# Patient Record
Sex: Female | Born: 1970 | Race: White | Hispanic: No | Marital: Married | Smoking: Former smoker
Health system: Southern US, Community
[De-identification: ages and names within clinical notes are randomized; demographics above are authoritative.]

## PROBLEM LIST (undated history)

## (undated) DIAGNOSIS — K602 Anal fissure, unspecified: Secondary | ICD-10-CM

## (undated) DIAGNOSIS — E669 Obesity, unspecified: Secondary | ICD-10-CM

## (undated) DIAGNOSIS — K219 Gastro-esophageal reflux disease without esophagitis: Secondary | ICD-10-CM

## (undated) DIAGNOSIS — E079 Disorder of thyroid, unspecified: Secondary | ICD-10-CM

## (undated) DIAGNOSIS — J449 Chronic obstructive pulmonary disease, unspecified: Secondary | ICD-10-CM

## (undated) DIAGNOSIS — T7840XA Allergy, unspecified, initial encounter: Secondary | ICD-10-CM

## (undated) DIAGNOSIS — M1990 Unspecified osteoarthritis, unspecified site: Secondary | ICD-10-CM

## (undated) HISTORY — DX: Obesity, unspecified: E66.9

## (undated) HISTORY — DX: Unspecified osteoarthritis, unspecified site: M19.90

## (undated) HISTORY — DX: Chronic obstructive pulmonary disease, unspecified: J44.9

## (undated) HISTORY — PX: HAND TENDON SURGERY: SHX663

## (undated) HISTORY — DX: Gastro-esophageal reflux disease without esophagitis: K21.9

## (undated) HISTORY — DX: Allergy, unspecified, initial encounter: T78.40XA

## (undated) HISTORY — PX: HAND SURGERY: SHX662

## (undated) HISTORY — DX: Disorder of thyroid, unspecified: E07.9

## (undated) HISTORY — DX: Anal fissure, unspecified: K60.2

---

## 2000-04-20 ENCOUNTER — Other Ambulatory Visit: Admission: RE | Admit: 2000-04-20 | Discharge: 2000-04-20 | Payer: Self-pay | Admitting: Obstetrics and Gynecology

## 2000-11-29 ENCOUNTER — Encounter (INDEPENDENT_AMBULATORY_CARE_PROVIDER_SITE_OTHER): Payer: Self-pay | Admitting: Specialist

## 2000-11-29 ENCOUNTER — Inpatient Hospital Stay (HOSPITAL_COMMUNITY): Admission: AD | Admit: 2000-11-29 | Discharge: 2000-12-01 | Payer: Self-pay | Admitting: Obstetrics and Gynecology

## 2001-04-18 ENCOUNTER — Other Ambulatory Visit: Admission: RE | Admit: 2001-04-18 | Discharge: 2001-04-18 | Payer: Self-pay | Admitting: Obstetrics & Gynecology

## 2001-10-25 ENCOUNTER — Encounter: Admission: RE | Admit: 2001-10-25 | Discharge: 2001-10-25 | Payer: Self-pay | Admitting: Family Medicine

## 2001-10-25 ENCOUNTER — Encounter: Payer: Self-pay | Admitting: Family Medicine

## 2004-03-24 ENCOUNTER — Encounter: Admission: RE | Admit: 2004-03-24 | Discharge: 2004-03-24 | Payer: Self-pay | Admitting: Family Medicine

## 2008-08-31 ENCOUNTER — Ambulatory Visit: Payer: Self-pay | Admitting: Cardiovascular Disease

## 2008-08-31 ENCOUNTER — Encounter: Payer: Self-pay | Admitting: Cardiovascular Disease

## 2008-08-31 LAB — CONVERTED CEMR LAB
BUN: 12 mg/dL (ref 6–23)
Basophils Relative: 4.6 % — ABNORMAL HIGH (ref 0.0–3.0)
CO2: 28 meq/L (ref 19–32)
Calcium: 9.2 mg/dL (ref 8.4–10.5)
Chloride: 106 meq/L (ref 96–112)
Creatinine, Ser: 0.9 mg/dL (ref 0.4–1.2)
Eosinophils Relative: 2.6 % (ref 0.0–5.0)
Free T4: 0.7 ng/dL (ref 0.6–1.6)
GFR calc non Af Amer: 73.78 mL/min (ref >60)
Glucose, Bld: 145 mg/dL — ABNORMAL HIGH (ref 70–99)
HCT: 43.2 % (ref 36.0–46.0)
Hemoglobin: 15 g/dL (ref 12.0–15.0)
Lymphocytes Relative: 29.6 % (ref 12.0–46.0)
MCHC: 34.6 g/dL (ref 30.0–36.0)
MCV: 89.6 fL (ref 78.0–100.0)
Monocytes Relative: 2.8 % — ABNORMAL LOW (ref 3.0–12.0)
Neutrophils Relative %: 60.4 % (ref 43.0–77.0)
Platelets: 250 10*3/uL (ref 150.0–400.0)
Potassium: 3.6 meq/L (ref 3.5–5.1)
Pro B Natriuretic peptide (BNP): 24 pg/mL (ref 0.0–100.0)
RBC: 4.82 M/uL (ref 3.87–5.11)
RDW: 12.8 % (ref 11.5–14.6)
Sed Rate: 16 mm/hr (ref 0–22)
Sodium: 140 meq/L (ref 135–145)
TSH: 1.37 microintl units/mL (ref 0.35–5.50)
WBC: 7.3 10*3/uL (ref 4.5–10.5)

## 2008-09-07 ENCOUNTER — Telehealth: Payer: Self-pay | Admitting: Cardiovascular Disease

## 2008-09-21 ENCOUNTER — Encounter: Payer: Self-pay | Admitting: Cardiovascular Disease

## 2008-09-21 ENCOUNTER — Ambulatory Visit: Payer: Self-pay

## 2008-09-21 ENCOUNTER — Ambulatory Visit: Payer: Self-pay | Admitting: Internal Medicine

## 2008-10-02 ENCOUNTER — Telehealth: Payer: Self-pay | Admitting: Cardiovascular Disease

## 2008-10-18 ENCOUNTER — Telehealth: Payer: Self-pay | Admitting: Cardiovascular Disease

## 2010-10-07 NOTE — Assessment & Plan Note (Signed)
[facility]                            CARDIOLOGY OFFICE NOTE   SHERELLE, CASTELLI                 MRN:          [id number]  DATE:08/31/2008                            DOB:          [birth year]    Ms. Alison Odonnell is a 40 year old patient followed by Dr. Purnell Shoemaker for an abnormal  EKG.  Unfortunately, I do not have any of these EKGs that there were  done at his office.  The patient indicates she was here for some  shortness of breath.  He did an initial EKG which was normal.  She  subsequently did 3 or 4 EKGs and showed some abnormality.  In talking to  the patient, she has not had palpitations or syncope.  She has had some  pressure in her chest with exertion.  However, primary complaint is  shortness of breath.  This has been increasing over the past year.  She  smokes as much as 2 packs per day.  She has quit in the past, but has  over 25-pack-year history of smoking.  She has not had a recent chest x-  ray or PFTs.  She does have a smoker's cough and a bit of a bronchitic  voice.  She has gained probably about 30 pounds over the last year.   The patient's review of system is otherwise negative.   Her past medical history is remarkable for smoking.  She has had tendon  surgery in the left hand, some tendinitis in the right wrist.   Her review of system is remarkable for history of reflux, chronic  fatigue, history of shortness of breath.   Family history is remarkable for father having a heart attack at age 65.  Mother is still alive.   She is only on Nexium for reflux.  She is happily married.  Her husband  is not with her.  He is older in his 41s, so her father was with her  today.  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  She does not exercise.  She has gained a quite a bit of weight.  She  smokes as much as 2 packs per day and does not drink.  She enjoys taking  care of dogs.  She has 3 Engineer, petroleum at home.   ALLERGIES:  She is allergic to PENICILLIN and SULFA.   PHYSICAL EXAMINATION:  VITAL SIGNS:  Remarkable for weight of 232, blood  pressure 130/80, pulse 76 and regular.  I monitored pulse throughout our  examined and there were no irregularities.  HEENT:  Unremarkable.  NECK:  Carotids have a right bruit.  No lymphadenopathy, thyromegaly, or  JVP elevation.  HEENT:  Unremarkable.  NECK:  Carotids are normal without bruit.  No lymphadenopathy,  thyromegaly, or JVP elevation.  LUNGS:  Clear.  Good diaphragmatic motion.  No wheezing.  CARDIAC:  S1, S2.  Normal heart sounds.  PMI normal.  ABDOMEN:  Benign.  Bowel sounds positive.  No AAA, no tenderness, no  bruit, no hepatosplenomegaly, no hepatojugular reflux, or tenderness.  EXTREMITIES:  Distal pulses are intact.  No edema.  She has  2 areas on  the right chin and 1 area on the left shin of erythema with plaquing.  She has punch biospy scars in the center of the 2 right ones.  NEURO:  Nonfocal.  SKIN:  Warm and dry.  MUSCULOSKELETAL:  No muscular weakness.   EKG is normal.  QT interval is 386.   IMPRESSION:  1. Abnormal EKG.  I will try to get the results from Dr. Harlene Salts      office, but apparently he has older machines and I suspect there      were artifact.  Her EKG is totally normal here and palpitations is      not really an issue for her.  2. Dyspnea, somewhat more worrisome.  She is a long-time smoker.  She      has a bronchitic voice.  We will get a chest x-ray today.  She will      have PFTs pre and post bronchodilator.  I suspect she has some      emphysema just coupled with her significant weight gain probably      explains it.  3. Right carotid bruit.  Check carotid duplex, probably will require      baby aspirin therapy.  4. In regards to her dyspnea since she does have a carotid bruit, we      will at least check a 2-D echocardiogram, assess RV and LV      function, rule out pulmonary hypertension.  Depending on her       initial workup, she may end up needing a treadmill test or      cardiopulmonary stress test.  We will check basic lab work today in      regards to her dyspnea.  We will check a CBC, BMP, TSH, T4, and a      sed rate.  5. Dermatological lesions in the lower extremities.  These are      somewhat concerning to me.  They initially started with a bruise.      She apparently said the biopsies only showed scar tissue.  However,      they seem more active and she says the lower one on the right shin      is spread.  I would like her to be seen by a dermatologist, as I      would like to make sure that there is no evidence of lichen planus,      discoid lupus, or T-cell lymphoma.   We will try to get her in to see [facility].  The patient  would like get these looked at again, as they keep her from wearing  capris and other shorts.   I will see her back after her testing.     [name]. Eden Emms, MD, [facility]  Electronically Signed    PCN/MedQ  DD: 08/31/2008  DT: 09/01/2008  Job #: [id number]

## 2012-01-27 ENCOUNTER — Other Ambulatory Visit: Payer: Self-pay | Admitting: Nurse Practitioner

## 2012-01-27 DIAGNOSIS — M549 Dorsalgia, unspecified: Secondary | ICD-10-CM

## 2012-01-27 DIAGNOSIS — M25559 Pain in unspecified hip: Secondary | ICD-10-CM

## 2012-01-29 ENCOUNTER — Other Ambulatory Visit: Payer: Self-pay

## 2012-02-05 ENCOUNTER — Other Ambulatory Visit: Payer: Self-pay

## 2012-08-13 ENCOUNTER — Other Ambulatory Visit: Payer: Self-pay | Admitting: Neurology

## 2013-03-24 ENCOUNTER — Other Ambulatory Visit: Payer: Self-pay | Admitting: Neurology

## 2013-03-28 ENCOUNTER — Other Ambulatory Visit: Payer: Self-pay

## 2013-03-28 NOTE — Telephone Encounter (Signed)
I have attempted to contact patient by phone at home and work. Both numbers are disconnected. There is a request for a medication refill. I will submit x 1 month, per previous order, and add note to call to schedule appointment.

## 2013-03-30 ENCOUNTER — Other Ambulatory Visit: Payer: Self-pay | Admitting: Neurology

## 2013-03-30 MED ORDER — BACLOFEN 10 MG PO TABS: 10.0000 mg | ORAL_TABLET | Freq: Every day | ORAL | Status: DC

## 2014-03-21 ENCOUNTER — Other Ambulatory Visit: Payer: Self-pay | Admitting: Obstetrics & Gynecology

## 2014-03-21 DIAGNOSIS — R928 Other abnormal and inconclusive findings on diagnostic imaging of breast: Secondary | ICD-10-CM

## 2014-04-03 ENCOUNTER — Ambulatory Visit
Admission: RE | Admit: 2014-04-03 | Discharge: 2014-04-03 | Disposition: A | Discharge status: Home / Self Care | Payer: BC Managed Care – PPO | Source: Ambulatory Visit | Attending: Obstetrics & Gynecology | Admitting: Obstetrics & Gynecology

## 2014-04-03 DIAGNOSIS — R928 Other abnormal and inconclusive findings on diagnostic imaging of breast: Secondary | ICD-10-CM

## 2014-11-13 ENCOUNTER — Other Ambulatory Visit (HOSPITAL_COMMUNITY): Payer: Self-pay | Admitting: Nurse Practitioner

## 2014-11-13 DIAGNOSIS — R1011 Right upper quadrant pain: Secondary | ICD-10-CM

## 2014-12-04 ENCOUNTER — Ambulatory Visit (HOSPITAL_COMMUNITY)
Admission: RE | Admit: 2014-12-04 | Discharge: 2014-12-04 | Disposition: A | Discharge status: Home / Self Care | Payer: BLUE CROSS/BLUE SHIELD | Source: Ambulatory Visit | Attending: Nurse Practitioner | Admitting: Nurse Practitioner

## 2014-12-04 DIAGNOSIS — R1011 Right upper quadrant pain: Secondary | ICD-10-CM | POA: Insufficient documentation

## 2014-12-04 MED ORDER — SINCALIDE 5 MCG IJ SOLR
0.0200 ug/kg | Freq: Once | INTRAMUSCULAR | Status: AC
  Administered 2014-12-04: 2.1 ug via INTRAVENOUS

## 2014-12-04 MED ORDER — TECHNETIUM TC 99M MEBROFENIN IV KIT
5.5000 | PACK | Freq: Once | INTRAVENOUS | Status: AC | PRN
  Administered 2014-12-04: 6 via INTRAVENOUS

## 2015-01-17 ENCOUNTER — Ambulatory Visit (INDEPENDENT_AMBULATORY_CARE_PROVIDER_SITE_OTHER): Payer: BLUE CROSS/BLUE SHIELD | Admitting: Physician Assistant

## 2015-01-17 ENCOUNTER — Encounter: Payer: Self-pay | Admitting: Gastroenterology

## 2015-01-17 ENCOUNTER — Encounter: Payer: Self-pay | Admitting: Physician Assistant

## 2015-01-17 ENCOUNTER — Other Ambulatory Visit (INDEPENDENT_AMBULATORY_CARE_PROVIDER_SITE_OTHER): Payer: BLUE CROSS/BLUE SHIELD

## 2015-01-17 VITALS — BP 110/76 | HR 64 | Ht 66.0 in | Wt 239.0 lb

## 2015-01-17 DIAGNOSIS — R1013 Epigastric pain: Secondary | ICD-10-CM | POA: Diagnosis not present

## 2015-01-17 DIAGNOSIS — K589 Irritable bowel syndrome without diarrhea: Secondary | ICD-10-CM | POA: Insufficient documentation

## 2015-01-17 DIAGNOSIS — K219 Gastro-esophageal reflux disease without esophagitis: Secondary | ICD-10-CM | POA: Diagnosis not present

## 2015-01-17 LAB — CBC WITH DIFFERENTIAL/PLATELET
Basophils Absolute: 0.1 10*3/uL (ref 0.0–0.1)
Basophils Relative: 0.9 % (ref 0.0–3.0)
Eosinophils Absolute: 0.2 10*3/uL (ref 0.0–0.7)
Eosinophils Relative: 2.1 % (ref 0.0–5.0)
HCT: 43 % (ref 36.0–46.0)
Hemoglobin: 14.9 g/dL (ref 12.0–15.0)
Lymphocytes Relative: 24.6 % (ref 12.0–46.0)
Lymphs Abs: 2 10*3/uL (ref 0.7–4.0)
MCHC: 34.7 g/dL (ref 30.0–36.0)
MCV: 93.9 fl (ref 78.0–100.0)
Monocytes Absolute: 0.7 10*3/uL (ref 0.1–1.0)
Monocytes Relative: 8.2 % (ref 3.0–12.0)
Neutro Abs: 5.3 10*3/uL (ref 1.4–7.7)
Neutrophils Relative %: 64.2 % (ref 43.0–77.0)
Platelets: 316 10*3/uL (ref 150.0–400.0)
RBC: 4.59 Mil/uL (ref 3.87–5.11)
RDW: 13.4 % (ref 11.5–15.5)
WBC: 8.3 10*3/uL (ref 4.0–10.5)

## 2015-01-17 LAB — COMPREHENSIVE METABOLIC PANEL
ALT: 28 U/L (ref 0–35)
AST: 20 U/L (ref 0–37)
Albumin: 4.5 g/dL (ref 3.5–5.2)
Alkaline Phosphatase: 64 U/L (ref 39–117)
BUN: 19 mg/dL (ref 6–23)
CO2: 27 mEq/L (ref 19–32)
Calcium: 9.7 mg/dL (ref 8.4–10.5)
Chloride: 103 mEq/L (ref 96–112)
Creatinine, Ser: 1.04 mg/dL (ref 0.40–1.20)
GFR: 60.59 mL/min (ref >60.00)
Glucose, Bld: 102 mg/dL — ABNORMAL HIGH (ref 70–99)
Potassium: 4.2 mEq/L (ref 3.5–5.1)
Sodium: 136 mEq/L (ref 135–145)
Total Bilirubin: 0.3 mg/dL (ref 0.2–1.2)
Total Protein: 7.3 g/dL (ref 6.0–8.3)

## 2015-01-17 LAB — HIGH SENSITIVITY CRP: CRP, High Sensitivity: 6.53 mg/L — ABNORMAL HIGH (ref 0.000–5.000)

## 2015-01-17 LAB — LIPASE: Lipase: 17 U/L (ref 11.0–59.0)

## 2015-01-17 LAB — IGA: IgA: 83 mg/dL (ref 68–378)

## 2015-01-17 MED ORDER — ESOMEPRAZOLE MAGNESIUM 40 MG PO CPDR: DELAYED_RELEASE_CAPSULE | ORAL | Status: DC

## 2015-01-17 MED ORDER — GLYCOPYRROLATE 1 MG PO TABS: ORAL_TABLET | ORAL | Status: DC

## 2015-01-17 NOTE — Patient Instructions (Addendum)
Please go to the basement level to have your labs drawn.   You have been scheduled for an endoscopy. Please follow written instructions given to you at your visit today. If you use inhalers (even only as needed), please bring them with you on the day of your procedure. Your physician has requested that you go to www.startemmi.com and enter the access code given to you at your visit today. This web site gives a general overview about your procedure. However, you should still follow specific instructions given to you by our office regarding your preparation for the procedure. We ordered the Nexium and Robinul forte 2 mg. [employer], [place] , [place].

## 2015-01-17 NOTE — Progress Notes (Signed)
Patient ID: Alison Odonnell, female   DOB: 05-03-1971, 44 y.o.   MRN: [id number]   Subjective:    Patient ID: Alison Odonnell, female    DOB: 02-01-1971, 44 y.o.   MRN: [id number]  HPI Alison Odonnell is a 44 year old white female new to GI today referred by [name] for evaluation of upper abdominal pain. Patient says that she began having symptoms of epigastric pain in March 2016 and her symptoms have gradually worsened since then. She is now having pain and discomfort every day which varies in intensity. She states that eating larger meals seems to worsen her pain and cause radiation through into her back. She has not had any nausea or vomiting appetite has been okay. She has gained about 80 pounds over the past 2 years. Bowel habits seem to alternate back and forth between loose stools and constipation. She had also been having some left lower quadrant discomfort the spring which she says is not been bothering her as much recently. He had upper abdominal ultrasound done at [facility] in [place] Spring 2016 which was read as normal. She was in [place] in March 2016 and having a lot of epigastric pain and had CT of the abdomen and pelvis done through an ER visit there. This is visible in Care Everywhere- she did not have oral contrast but this was done with IV contrast and was negative. More recent [facility] HIDA scan within normal limits. She was also H. pylori positive and treated with a course of PPI clarithromycin and metronidazole for 14 days with no improvement in her symptoms. She has had some heartburn and indigestion despite Prilosec. She also denies any current use of aspirin or NSAIDs. She has been on Prilosec 20 mg by mouth twice daily over the past couple of months though she says she frequently forgets the second dose but again no improvement in her symptoms. During the course of the interview patient tells me that her daughter is being worked up for a tumor on her spine and  that her husband has stage IV/stable lung cancer.  Review of Systems Pertinent positive and negative review of systems were noted in the above HPI section.  All other review of systems was otherwise negative.  Outpatient Encounter Prescriptions as of 01/17/2015  Medication Sig  . levothyroxine (SYNTHROID, LEVOTHROID) 50 MCG tablet Take 50 mcg by mouth daily before breakfast.  . omeprazole (PRILOSEC) 20 MG capsule Take 20 mg by mouth 2 (two) times daily before a meal.  . tizanidine (ZANAFLEX) 2 MG capsule Take 2 mg by mouth 3 (three) times daily as needed for muscle spasms.  Marland Kitchen esomeprazole (NEXIUM) 40 MG capsule Take 1 capsule in the am daily.  Marland Kitchen glycopyrrolate (ROBINUL) 1 MG tablet Take 1 tab in the am.  . [DISCONTINUED] baclofen (LIORESAL) 10 MG tablet Take 1 tablet (10 mg total) by mouth at bedtime. (Patient not taking: Reported on 01/17/2015)   No facility-administered encounter medications on file as of 01/17/2015.   No Known Allergies Patient Active Problem List   Diagnosis Date Noted  . GERD (gastroesophageal reflux disease) 01/17/2015  . IBS (irritable bowel syndrome) 01/17/2015   Social History   Social History  . Marital Status: Married    Spouse Name: N/A  . Number of Children: N/A  . Years of Education: N/A   Occupational History  . Artist    Social History Main Topics  . Smoking status: Current Every Day Smoker  .  Smokeless tobacco: Never Used  . Alcohol Use: No  . Drug Use: No  . Sexual Activity: Not on file   Other Topics Concern  . Not on file   Social History Narrative    [name]'s family history includes Bladder Cancer in her mother; Diabetes in her mother; Heart disease in her father.      Objective:    Filed Vitals:   01/17/15 0833  BP: 110/76  Pulse: 64    Physical Exam  well-developed white female in no acute distress, blood pressure 110/76 pulse 64 height 5 foot 6 weight 239, MI 38.5. HEENT; nontraumatic normocephalic EOMI  PERRLA sclera anicteric, Supple; no JVD, Cardiovascular; regular rate and rhythm with S1-S2 no murmur or gallop, Pulmonary clear bilaterally, Abdomen; large soft she has tenderness in the epigastrium and right upper quadrant there is no guarding or rebound no palpable mass or hepatosplenomegaly bowel sounds are present, Rectal ;exam not done, Extremities; no clubbing cyanosis or edema skin warm and dry, Neuropsych; mood and affect appropriate       Assessment & Plan:   #1 44 yo female with several month hx of epigastric pain- negative workup to date- with [place] /[facility] HIDA/ Ct Abd 3/16, and no change in sxs with PPI or treatment for HPylori. Etiology of sxs is not clear. R/O PUD, other gastropathy, functional dyspepsia/stress induced  Plan; Schedule for EGD with Dr Russella Dar . Procedure discussed in detail with pt and she is agreeable to proceed. Consider repeat CT abd/pelvis if EGD negative  Stop prilosec and start Nexium 40 mg po qam Trial of Robinul forte 2 mg po qam Celiac markers,cbc cmet, crp       Qusai Kem S Adalin Vanderploeg PA-C 01/17/2015   Cc: No ref. provider found

## 2015-01-17 NOTE — Progress Notes (Signed)
Reviewed and agree with management plan.  Ezariah Nace T. Karolyna Bianchini, MD FACG 

## 2015-01-18 LAB — TISSUE TRANSGLUTAMINASE, IGA: Tissue Transglutaminase Ab, IgA: 1 U/mL (ref <4)

## 2015-01-21 ENCOUNTER — Telehealth: Payer: Self-pay | Admitting: Physician Assistant

## 2015-01-21 NOTE — Telephone Encounter (Signed)
Patient is aware of her lab results. Very anxious to get the EGD. She has limitations on her time for the EGD but really wants to do it sooner. Can she change providers?

## 2015-01-22 ENCOUNTER — Other Ambulatory Visit: Payer: Self-pay

## 2015-01-22 DIAGNOSIS — R1013 Epigastric pain: Secondary | ICD-10-CM

## 2015-01-22 NOTE — Telephone Encounter (Signed)
OK. Transferring care to [facility].

## 2015-01-22 NOTE — Telephone Encounter (Signed)
OK to add on in [facility] as a 4pm case for me next week or can change providers if she prefers

## 2015-01-22 NOTE — Telephone Encounter (Signed)
I have left message for the patient to call back 

## 2015-01-22 NOTE — Telephone Encounter (Signed)
That is fine, thanks for letting me know. I will see her on 01/24/15 for the EGD. Thanks

## 2015-01-22 NOTE — Telephone Encounter (Signed)
Alison Odonnell- this pt is anxious to get EGD done sooner- she was new when I saw her - are you ok with me swithching her to Dr Adela Lank if he can do EGD sooner?  Beth- if Dr Russella Dar ok's then go ahead with EGD with Dr. Adela Lank

## 2015-01-22 NOTE — Telephone Encounter (Signed)
Thank you Dr Russella Dar. I did move her to Dr Lanetta Inch schedule. Your schedule was full except for 01/29/15 which was the day she could not come in. I have explained the situation to the patient.

## 2015-01-22 NOTE — Telephone Encounter (Signed)
Dr Adela Lank For your review. She has an EGD with you on 01/24/15.

## 2015-01-24 ENCOUNTER — Encounter: Payer: Self-pay | Admitting: Gastroenterology

## 2015-01-24 ENCOUNTER — Ambulatory Visit (AMBULATORY_SURGERY_CENTER): Payer: BLUE CROSS/BLUE SHIELD | Admitting: Gastroenterology

## 2015-01-24 ENCOUNTER — Encounter: Payer: BLUE CROSS/BLUE SHIELD | Admitting: Gastroenterology

## 2015-01-24 VITALS — BP 121/68 | HR 58 | Temp 97.7°F | Resp 15 | Ht 66.0 in | Wt 239.0 lb

## 2015-01-24 DIAGNOSIS — R1013 Epigastric pain: Secondary | ICD-10-CM | POA: Diagnosis present

## 2015-01-24 MED ORDER — SODIUM CHLORIDE 0.9 % IV SOLN: 500.0000 mL | INTRAVENOUS | Status: DC

## 2015-01-24 NOTE — Patient Instructions (Signed)
Discharge instructions given. Biopsies taken. Resume previous medications. YOU HAD AN ENDOSCOPIC PROCEDURE TODAY AT THE [facility]:   Refer to the procedure report that was given to you for any specific questions about what was found during the examination.  If the procedure report does not answer your questions, please call your gastroenterologist to clarify.  If you requested that your care partner not be given the details of your procedure findings, then the procedure report has been included in a sealed envelope for you to review at your convenience later.  YOU SHOULD EXPECT: Some feelings of bloating in the abdomen. Passage of more gas than usual.  Walking can help get rid of the air that was put into your GI tract during the procedure and reduce the bloating. If you had a lower endoscopy (such as a colonoscopy or flexible sigmoidoscopy) you may notice spotting of blood in your stool or on the toilet paper. If you underwent a bowel prep for your procedure, you may not have a normal bowel movement for a few days.  Please Note:  You might notice some irritation and congestion in your nose or some drainage.  This is from the oxygen used during your procedure.  There is no need for concern and it should clear up in a day or so.  SYMPTOMS TO REPORT IMMEDIATELY:   Following upper endoscopy (EGD)  Vomiting of blood or coffee ground material  New chest pain or pain under the shoulder blades  Painful or persistently difficult swallowing  New shortness of breath  Fever of 100F or higher  Black, tarry-looking stools  For urgent or emergent issues, a gastroenterologist can be reached at any hour by calling [phone].   DIET: Your first meal following the procedure should be a small meal and then it is ok to progress to your normal diet. Heavy or fried foods are harder to digest and may make you feel nauseous or bloated.  Likewise, meals heavy in dairy and vegetables can increase  bloating.  Drink plenty of fluids but you should avoid alcoholic beverages for 24 hours.  ACTIVITY:  You should plan to take it easy for the rest of today and you should NOT DRIVE or use heavy machinery until tomorrow (because of the sedation medicines used during the test).    FOLLOW UP: Our staff will call the number listed on your records the next business day following your procedure to check on you and address any questions or concerns that you may have regarding the information given to you following your procedure. If we do not reach you, we will leave a message.  However, if you are feeling well and you are not experiencing any problems, there is no need to return our call.  We will assume that you have returned to your regular daily activities without incident.  If any biopsies were taken you will be contacted by phone or by letter within the next 1-3 weeks.  Please call us at [phone] if you have not heard about the biopsies in 3 weeks.    SIGNATURES/CONFIDENTIALITY: You and/or your care partner have signed paperwork which will be entered into your electronic medical record.  These signatures attest to the fact that that the information above on your After Visit Summary has been reviewed and is understood.  Full responsibility of the confidentiality of this discharge information lies with you and/or your care-partner. 

## 2015-01-24 NOTE — Op Note (Signed)
[facility] [address] [place], [address]   ENDOSCOPY PROCEDURE REPORT  PATIENT: Alison Odonnell, Alison Odonnell  MR#: [id number] BIRTHDATE: [birth year] , 44  yrs. old GENDER: female ENDOSCOPIST: Ileene Patrick, MD REFERRED BY:  Marin Comment, FNP PROCEDURE DATE:  01/24/2015 PROCEDURE:  EGD w/ biopsy ASA CLASS:     Class II INDICATIONS:  epigastric pain. MEDICATIONS: Propofol 200 mg IV TOPICAL ANESTHETIC:  DESCRIPTION OF PROCEDURE: After the risks benefits and alternatives of the procedure were thoroughly explained, informed consent was obtained.  The LB [id number] endoscope was introduced through the mouth and advanced to the second portion of the duodenum , Without limitations.  The instrument was slowly withdrawn as the mucosa was fully examined.Marland Kitchen    FINDINGS: The esophagus appeared normal without mucosal abnormalities.  No evidence of esophagitis.  DH, GEJ, and SCJ located 38cm from the incisors.  The stomach was normal without mucosal abnormalities.  No mass lesions or ulcers appreciated.  Biopsies were taken from the gastric body, incisura, and antrum to rule out H pylori.  The duodenal bulb and 2nd portion of the duodenum were normal without mucosal abnormalities.  Retroflexed views revealed no abnormalities.     The scope was then withdrawn from the patient and the procedure completed.  COMPLICATIONS: There were no immediate complications.  ENDOSCOPIC IMPRESSION: Normal esophagus, stomach, and duodenum.  Gastric biopsies taken to rule out H pylori.  RECOMMENDATIONS: Resume diet Resume medications You will be contacted with the pathology results.  Further recommendations will be made based on these results.  eSigned:  Ileene Patrick, MD 01/24/2015 8:26 AM    FA:OZHYQM Wells FNP, the patient  PATIENT NAME:  Darsi, Tien MR#: [id number]

## 2015-01-24 NOTE — Progress Notes (Signed)
Called to room to assist during endoscopic procedure.  Patient ID and intended procedure confirmed with present staff. Received instructions for my participation in the procedure from the performing physician.  

## 2015-01-24 NOTE — Progress Notes (Signed)
Report to PACU, RN, vss, BBS= Clear.  

## 2015-01-25 ENCOUNTER — Telehealth: Payer: Self-pay

## 2015-01-25 NOTE — Telephone Encounter (Signed)
  Follow up Call-  Call back number 01/24/2015  Post procedure Call Back phone  # [phone] ext [phone]  Permission to leave phone message Yes     Patient questions:  Do you have a fever, pain , or abdominal swelling? No. Pain Score  0 *  Have you tolerated food without any problems? Yes.    Have you been able to return to your normal activities? Yes.    Do you have any questions about your discharge instructions: Diet   No. Medications  No. Follow up visit  No.  Do you have questions or concerns about your Care? No.  Actions: * If pain score is 4 or above: No action needed, pain <4.  No problems noted per the pt. maw

## 2015-01-26 ENCOUNTER — Encounter: Payer: Self-pay | Admitting: Physician Assistant

## 2015-01-29 ENCOUNTER — Other Ambulatory Visit: Payer: Self-pay

## 2015-01-29 DIAGNOSIS — R109 Unspecified abdominal pain: Secondary | ICD-10-CM

## 2015-02-04 ENCOUNTER — Ambulatory Visit (INDEPENDENT_AMBULATORY_CARE_PROVIDER_SITE_OTHER)
Admission: RE | Admit: 2015-02-04 | Discharge: 2015-02-04 | Disposition: A | Discharge status: Home / Self Care | Payer: BLUE CROSS/BLUE SHIELD | Source: Ambulatory Visit | Attending: Physician Assistant | Admitting: Physician Assistant

## 2015-02-04 DIAGNOSIS — R109 Unspecified abdominal pain: Secondary | ICD-10-CM | POA: Diagnosis not present

## 2015-02-04 MED ORDER — IOHEXOL 300 MG/ML  SOLN
100.0000 mL | Freq: Once | INTRAMUSCULAR | Status: AC | PRN
  Administered 2015-02-04: 100 mL via INTRAVENOUS

## 2015-02-08 ENCOUNTER — Encounter: Payer: Self-pay | Admitting: Physician Assistant

## 2015-02-08 ENCOUNTER — Telehealth: Payer: Self-pay

## 2015-02-08 NOTE — Telephone Encounter (Signed)
-----   Message from Sammuel Cooper, PA-C sent at 02/04/2015  2:22 PM EDT ----- Please let pt know the Ct scan is negative except for mild fatty infiltration of liver

## 2015-02-08 NOTE — Telephone Encounter (Signed)
Patient continues with spells of epigastric pain. She has loose stools now. What would be your recommendations?

## 2015-02-11 ENCOUNTER — Other Ambulatory Visit: Payer: Self-pay

## 2015-02-11 MED ORDER — GLYCOPYRROLATE 1 MG PO TABS: 1.0000 mg | ORAL_TABLET | Freq: Two times a day (BID) | ORAL | Status: DC

## 2015-02-11 MED ORDER — SUCRALFATE 1 G PO TABS: 1.0000 g | ORAL_TABLET | Freq: Three times a day (TID) | ORAL | Status: DC

## 2015-02-11 NOTE — Telephone Encounter (Signed)
Egd did show gastritis- lets continue nexium 40 mg po daily and add carafate 1 gm between meals and at bedtime for one month. She can also increase Robinul forte to one twice daily. Ask her to give that a few weeks, then can schedule follow up with me or Dr Adela Lank in 3-4 weeks. She has a lot of personal stress with family illness which may be contributing- I hope this helps her

## 2015-02-12 NOTE — Telephone Encounter (Signed)
Information sent through email. Message left on her voicemail advising to check the email. Patient acknowledged the email.

## 2015-03-06 ENCOUNTER — Encounter: Payer: BLUE CROSS/BLUE SHIELD | Admitting: Gastroenterology

## 2015-03-20 ENCOUNTER — Encounter: Payer: BLUE CROSS/BLUE SHIELD | Admitting: Gastroenterology

## 2016-02-03 ENCOUNTER — Other Ambulatory Visit: Payer: Self-pay | Admitting: Physician Assistant

## 2016-02-03 ENCOUNTER — Other Ambulatory Visit: Payer: Self-pay | Admitting: Obstetrics & Gynecology

## 2016-02-04 LAB — CYTOLOGY - PAP

## 2016-04-08 ENCOUNTER — Other Ambulatory Visit: Payer: Self-pay | Admitting: Surgery

## 2016-05-11 ENCOUNTER — Other Ambulatory Visit: Payer: Self-pay | Admitting: Physician Assistant

## 2017-03-01 ENCOUNTER — Other Ambulatory Visit: Payer: Self-pay | Admitting: Obstetrics & Gynecology

## 2017-03-01 DIAGNOSIS — N63 Unspecified lump in unspecified breast: Secondary | ICD-10-CM

## 2017-03-19 ENCOUNTER — Other Ambulatory Visit: Payer: BLUE CROSS/BLUE SHIELD

## 2017-03-25 ENCOUNTER — Other Ambulatory Visit: Payer: BLUE CROSS/BLUE SHIELD

## 2017-04-01 ENCOUNTER — Other Ambulatory Visit: Payer: Self-pay | Admitting: Physician Assistant

## 2017-04-22 ENCOUNTER — Other Ambulatory Visit: Payer: Self-pay | Admitting: Obstetrics & Gynecology

## 2017-04-22 ENCOUNTER — Ambulatory Visit
Admission: RE | Admit: 2017-04-22 | Discharge: 2017-04-22 | Disposition: A | Discharge status: Home / Self Care | Payer: BLUE CROSS/BLUE SHIELD | Source: Ambulatory Visit | Attending: Obstetrics & Gynecology | Admitting: Obstetrics & Gynecology

## 2017-04-22 ENCOUNTER — Ambulatory Visit
Admission: RE | Admit: 2017-04-22 | Discharge: 2017-04-22 | Disposition: A | Discharge status: Home / Self Care | Payer: Self-pay | Source: Ambulatory Visit | Attending: Obstetrics & Gynecology | Admitting: Obstetrics & Gynecology

## 2017-04-22 DIAGNOSIS — N63 Unspecified lump in unspecified breast: Secondary | ICD-10-CM

## 2018-05-27 ENCOUNTER — Other Ambulatory Visit: Payer: Self-pay | Admitting: Obstetrics and Gynecology

## 2018-05-27 DIAGNOSIS — Z1231 Encounter for screening mammogram for malignant neoplasm of breast: Secondary | ICD-10-CM

## 2018-06-01 ENCOUNTER — Ambulatory Visit
Admission: RE | Admit: 2018-06-01 | Discharge: 2018-06-01 | Disposition: A | Discharge status: Home / Self Care | Payer: BLUE CROSS/BLUE SHIELD | Source: Ambulatory Visit | Attending: Obstetrics and Gynecology | Admitting: Obstetrics and Gynecology

## 2018-06-01 DIAGNOSIS — Z1231 Encounter for screening mammogram for malignant neoplasm of breast: Secondary | ICD-10-CM | POA: Insufficient documentation

## 2018-06-10 ENCOUNTER — Other Ambulatory Visit: Payer: Self-pay | Admitting: Obstetrics and Gynecology

## 2018-06-10 DIAGNOSIS — N6320 Unspecified lump in the left breast, unspecified quadrant: Secondary | ICD-10-CM

## 2018-08-19 ENCOUNTER — Other Ambulatory Visit: Payer: Self-pay

## 2018-08-19 ENCOUNTER — Ambulatory Visit
Admission: RE | Admit: 2018-08-19 | Discharge: 2018-08-19 | Disposition: A | Discharge status: Home / Self Care | Payer: BLUE CROSS/BLUE SHIELD | Source: Ambulatory Visit | Attending: Obstetrics and Gynecology | Admitting: Obstetrics and Gynecology

## 2018-08-19 ENCOUNTER — Other Ambulatory Visit: Payer: BLUE CROSS/BLUE SHIELD

## 2018-08-19 DIAGNOSIS — N6320 Unspecified lump in the left breast, unspecified quadrant: Secondary | ICD-10-CM | POA: Insufficient documentation

## 2019-11-23 ENCOUNTER — Other Ambulatory Visit
Admission: RE | Admit: 2019-11-23 | Discharge: 2019-11-23 | Disposition: A | Discharge status: Home / Self Care | Payer: BC Managed Care – PPO | Source: Ambulatory Visit | Attending: Family Medicine | Admitting: Family Medicine

## 2019-11-23 DIAGNOSIS — R0789 Other chest pain: Secondary | ICD-10-CM | POA: Insufficient documentation

## 2019-11-23 LAB — TROPONIN I (HIGH SENSITIVITY): Troponin I (High Sensitivity): 4 ng/L (ref <18)

## 2020-02-12 ENCOUNTER — Other Ambulatory Visit
Admission: RE | Admit: 2020-02-12 | Discharge: 2020-02-12 | Disposition: A | Discharge status: Home / Self Care | Payer: BC Managed Care – PPO | Source: Ambulatory Visit | Attending: Cardiology | Admitting: Cardiology

## 2020-02-12 ENCOUNTER — Other Ambulatory Visit: Payer: Self-pay

## 2020-02-12 DIAGNOSIS — Z20822 Contact with and (suspected) exposure to covid-19: Secondary | ICD-10-CM | POA: Insufficient documentation

## 2020-02-12 DIAGNOSIS — Z01812 Encounter for preprocedural laboratory examination: Secondary | ICD-10-CM | POA: Insufficient documentation

## 2020-02-13 ENCOUNTER — Other Ambulatory Visit: Payer: BC Managed Care – PPO

## 2020-02-13 LAB — SARS CORONAVIRUS 2 (TAT 6-24 HRS): SARS Coronavirus 2: NEGATIVE

## 2020-02-14 ENCOUNTER — Other Ambulatory Visit: Payer: Self-pay | Admitting: Cardiology

## 2020-02-15 ENCOUNTER — Other Ambulatory Visit: Payer: Self-pay

## 2020-02-15 ENCOUNTER — Encounter
Admission: RE | Disposition: A | Discharge status: Home / Self Care | Payer: Self-pay | Source: Home / Self Care | Attending: Cardiology

## 2020-02-15 ENCOUNTER — Encounter: Payer: Self-pay | Admitting: Cardiology

## 2020-02-15 ENCOUNTER — Ambulatory Visit
Admission: RE | Admit: 2020-02-15 | Discharge: 2020-02-15 | Disposition: A | Discharge status: Home / Self Care | Payer: BC Managed Care – PPO | Attending: Cardiology | Admitting: Cardiology

## 2020-02-15 DIAGNOSIS — E1169 Type 2 diabetes mellitus with other specified complication: Secondary | ICD-10-CM | POA: Diagnosis not present

## 2020-02-15 DIAGNOSIS — E7800 Pure hypercholesterolemia, unspecified: Secondary | ICD-10-CM | POA: Insufficient documentation

## 2020-02-15 DIAGNOSIS — K589 Irritable bowel syndrome without diarrhea: Secondary | ICD-10-CM | POA: Insufficient documentation

## 2020-02-15 DIAGNOSIS — Z882 Allergy status to sulfonamides status: Secondary | ICD-10-CM | POA: Insufficient documentation

## 2020-02-15 DIAGNOSIS — E785 Hyperlipidemia, unspecified: Secondary | ICD-10-CM | POA: Diagnosis not present

## 2020-02-15 DIAGNOSIS — Z6839 Body mass index (BMI) 39.0-39.9, adult: Secondary | ICD-10-CM | POA: Diagnosis not present

## 2020-02-15 DIAGNOSIS — Z79899 Other long term (current) drug therapy: Secondary | ICD-10-CM | POA: Insufficient documentation

## 2020-02-15 DIAGNOSIS — R9439 Abnormal result of other cardiovascular function study: Secondary | ICD-10-CM | POA: Diagnosis present

## 2020-02-15 DIAGNOSIS — Z880 Allergy status to penicillin: Secondary | ICD-10-CM | POA: Insufficient documentation

## 2020-02-15 DIAGNOSIS — Z87891 Personal history of nicotine dependence: Secondary | ICD-10-CM | POA: Insufficient documentation

## 2020-02-15 DIAGNOSIS — R079 Chest pain, unspecified: Secondary | ICD-10-CM | POA: Diagnosis not present

## 2020-02-15 HISTORY — PX: LEFT HEART CATH AND CORONARY ANGIOGRAPHY: CATH118249

## 2020-02-15 LAB — GLUCOSE, CAPILLARY: Glucose-Capillary: 114 mg/dL — ABNORMAL HIGH (ref 70–99)

## 2020-02-15 SURGERY — LEFT HEART CATH AND CORONARY ANGIOGRAPHY
Anesthesia: Moderate Sedation | Laterality: Left

## 2020-02-15 MED ORDER — SODIUM CHLORIDE 0.9% FLUSH: 3.0000 mL | Freq: Two times a day (BID) | INTRAVENOUS | Status: DC

## 2020-02-15 MED ORDER — SODIUM CHLORIDE 0.9 % IV SOLN: 250.0000 mL | INTRAVENOUS | Status: DC | PRN

## 2020-02-15 MED ORDER — SODIUM CHLORIDE 0.9% FLUSH: 3.0000 mL | INTRAVENOUS | Status: DC | PRN

## 2020-02-15 MED ORDER — FENTANYL CITRATE (PF) 100 MCG/2ML IJ SOLN
INTRAMUSCULAR | Status: AC
  Filled 2020-02-15: qty 2

## 2020-02-15 MED ORDER — HEPARIN (PORCINE) IN NACL 1000-0.9 UT/500ML-% IV SOLN
INTRAVENOUS | Status: AC
  Filled 2020-02-15: qty 1000

## 2020-02-15 MED ORDER — SODIUM CHLORIDE 0.9 % WEIGHT BASED INFUSION: 1.0000 mL/kg/h | INTRAVENOUS | Status: DC

## 2020-02-15 MED ORDER — FENTANYL CITRATE (PF) 100 MCG/2ML IJ SOLN
INTRAMUSCULAR | Status: DC | PRN
Start: 2020-02-15 — End: 2020-02-15
  Administered 2020-02-15 (×2): 25 ug via INTRAVENOUS

## 2020-02-15 MED ORDER — LABETALOL HCL 5 MG/ML IV SOLN: 10.0000 mg | INTRAVENOUS | Status: DC | PRN

## 2020-02-15 MED ORDER — ONDANSETRON HCL 4 MG/2ML IJ SOLN: 4.0000 mg | Freq: Four times a day (QID) | INTRAMUSCULAR | Status: DC | PRN

## 2020-02-15 MED ORDER — HYDRALAZINE HCL 20 MG/ML IJ SOLN: 10.0000 mg | INTRAMUSCULAR | Status: DC | PRN

## 2020-02-15 MED ORDER — ACETAMINOPHEN 325 MG PO TABS: 650.0000 mg | ORAL_TABLET | ORAL | Status: DC | PRN

## 2020-02-15 MED ORDER — ASPIRIN 81 MG PO CHEW: 81.0000 mg | CHEWABLE_TABLET | ORAL | Status: DC

## 2020-02-15 MED ORDER — MIDAZOLAM HCL 2 MG/2ML IJ SOLN
INTRAMUSCULAR | Status: AC
  Filled 2020-02-15: qty 2

## 2020-02-15 MED ORDER — IOHEXOL 300 MG/ML  SOLN
INTRAMUSCULAR | Status: DC | PRN
  Administered 2020-02-15: 65 mL

## 2020-02-15 MED ORDER — MIDAZOLAM HCL 2 MG/2ML IJ SOLN
INTRAMUSCULAR | Status: DC | PRN
  Administered 2020-02-15 (×2): 1 mg via INTRAVENOUS

## 2020-02-15 MED ORDER — HEPARIN (PORCINE) IN NACL 1000-0.9 UT/500ML-% IV SOLN
INTRAVENOUS | Status: DC | PRN
  Administered 2020-02-15: 500 mL

## 2020-02-15 MED ORDER — SODIUM CHLORIDE 0.9 % WEIGHT BASED INFUSION: 3.0000 mL/kg/h | INTRAVENOUS | Status: AC

## 2020-02-15 SURGICAL SUPPLY — 10 items
CATH INFINITI 5FR JL4 (CATHETERS) ×2 IMPLANT
CATH INFINITI JR4 5F (CATHETERS) ×2 IMPLANT
DEVICE CLOSURE MYNXGRIP 5F (Vascular Products) ×2 IMPLANT
KIT MANI 3VAL PERCEP (MISCELLANEOUS) ×3 IMPLANT
NDL PERC 18GX7CM (NEEDLE) IMPLANT
NEEDLE PERC 18GX7CM (NEEDLE) ×3 IMPLANT
PACK CARDIAC CATH (CUSTOM PROCEDURE TRAY) ×3 IMPLANT
SHEATH AVANTI 5FR X 11CM (SHEATH) ×2 IMPLANT
WIRE GUIDERIGHT .035X150 (WIRE) ×2 IMPLANT
WIRE HITORQ VERSACORE ST 145CM (WIRE) ×2 IMPLANT

## 2020-02-15 NOTE — Progress Notes (Signed)
Dr. Lady Gary at bedside, speaking with pt. And fiance Theodis Sato re: cath results. Both verbalize understanding of conversation.

## 2020-02-15 NOTE — H&P (Signed)
. Chief Complaint: Chief Complaint  Patient presents with  . Hyperlipidemia  Date of Service: 01/31/2020 Date of Birth: March 02, 1971 PCP: Dion Body, MD  History of Present Illness: Ms. Alison Odonnell is a 49 y.o.female patient with a past medical history significant for diet-controlled type 2 diabetes, hypercholesterolemia, former tobacco abuse, morbid obesity, and family history of premature CAD who presents to review stress test and echocardiogram results. She continues to experience exertional shortness of breath with associated chest tightness. Symptoms typically resolve with rest. She also admits to occasional bilateral lower extremity swelling but denies associated orthopnea or PND. She denies palpitations, dizziness, lightheadedness, or syncopal/presyncopal episodes. She also admits to bilateral lower extremity cramping, more prevalent in her toes, occurring with activity or at rest. Since the last office visit, she has lost 9 pounds through intermittent fasting.   We discussed the results of the stress test and echocardiogram at today's visit. Stress test on 01/22/20 revealed a moderate perfusion abnormality of moderate intensity in the anterior regions on stress images. Echocardiogram revealed normal RV and LV systolic function with an EF estimated greater than 55% with no significant valvular abnormalities.   Past Medical and Surgical History  Past Medical History Past Medical History:  Diagnosis Date  . Breast cyst  . Chronic low back pain  . Genital warts  . History of stomach ulcers  . IBS (irritable bowel syndrome)  . Necrobiosis lipoidica diabeticorum (CMS-HCC)   Past Surgical History She has a past surgical history that includes Closed Reduction Toe Fracture; other surgery; Colposcopy; and Colonoscopy (01/08/2020).   Medications and Allergies  Current Medications  Current Outpatient Medications  Medication Sig Dispense Refill  . ascorbic acid, vitamin C, (VITAMIN C) 500 MG  tablet Take 500 mg by mouth once daily  . blood glucose diagnostic test strip Check blood sugar fasting once daily. ONE TOUCH ULTRA DX E11.69 100 each 1  . blood glucose meter kit Use as directed (Check blood sugar fasting once daily. ONE TOUCH ULTRA DX E11.69) 1 each 0  . calcium carbonate-vitamin D3 (CALTRATE 600+D) 600 mg(1,$RemoveBeforeD'500mg'ppQUgztGDDlBkJ$ ) -400 unit tablet Take 1 tablet by mouth 2 (two) times daily with meals  . cyanocobalamin (VITAMIN B12) 1000 MCG tablet Take 1,000 mcg by mouth once daily  . lancets Check blood sugar fasting once daily. ONE TOUCH ULTRA DX E11.69 100 each 1  . levocetirizine (XYZAL) 5 MG tablet Take 1 tablet (5 mg total) by mouth every evening 90 tablet 1  . multivitamin tablet Take 1 tablet by mouth once daily  . omeprazole (PRILOSEC) 20 MG DR capsule TAKE ONE CAPSULE BY MOUTH TWICE DAILY AS NEEDED 60 capsule 3  . pravastatin (PRAVACHOL) 40 MG tablet TAKE ONE TABLET BY MOUTH NIGHTLY 90 tablet 1  . TURMERIC ORAL Take by mouth  . valACYclovir (VALTREX) 1000 MG tablet TAKE TWO TABLETS BY MOUTH TWICE DAILY FOR ONE DAY FOR ACUTE OUTBREAK 8 tablet 2   No current facility-administered medications for this visit.   Allergies: Adipex-p [phentermine], Penicillins, Shrimp, and Sulfa (sulfonamide antibiotics)  Social and Family History  Social History reports that she has quit smoking. She has never used smokeless tobacco. She reports that she does not drink alcohol and does not use drugs.  Family History Family History  Problem Relation Age of Onset  . Osteoporosis (Thinning of bones) Mother  . Diabetes Mother  . High blood pressure (Hypertension) Mother  . Heart disease Father  . Myocardial Infarction (Heart attack) Father  . High blood pressure (Hypertension) Father  .  Diabetes Brother  . Neurofibromatosis Daughter 21   Review of Systems   Review of Systems: The patient denies chest pain, shortness of breath, orthopnea, paroxysmal nocturnal dyspnea, pedal edema, palpitations,  heart racing, fatigue, dizziness, lightheadedness, presyncope, syncope, leg pain, leg cramping. Review of 12 Systems is negative except as described in HPI.   Physical Examination   Vitals:BP 120/78  Pulse 63  Ht 167.6 cm ($RemoveB'5\' 6"'hiwqMKzf$ )  Wt (!) 109.8 kg (242 lb)  SpO2 97%  BMI 39.06 kg/m  Ht:167.6 cm ($RemoveBefo'5\' 6"'ZZoGzqQjAcZ$ ) Wt:(!) 109.8 kg (242 lb) LVD:IXVE surface area is 2.26 meters squared. Body mass index is 39.06 kg/m.  General: Well developed, obese. In no acute distress HEENT: Pupils equally reactive to light and accomodation  Neck: Supple without thyromegaly, or goiter. Carotid pulses 2+. No carotid bruits present.  Pulmonary: Clear to auscultation bilaterally; no wheezes, rales, rhonchi Cardiovascular: Regular rate and rhythm. No gallops, murmurs or rubs Gastrointestinal: Soft nontender, nondistended, with normal bowel sounds Extremities: No cyanosis, clubbing, or edema Peripheral Pulses: 2+ in upper extremities, 2+ in lower extremities  Neurology: Alert and oriented X3 Pysch: Good affect. Responds appropriately  Assessment and Plan   49 y.o. female with  1. Type 2 diabetes mellitus with hyperlipidemia (A1c 6.6% - 08/04/19) - diet controlled  -Diet controlled; continue routine f/u with PCP  2. Morbid obesity due to excess calories (CMS-HCC)  -Continue to work on weight loss through dietary modifications  3. Pure hypercholesterolemia (LDL 95 - 08/04/19)  -Encouraged compliance with pravastatin $RemoveBeforeDE'40mg'OJvJfVUmiPSdLpl$  daily  4. Abnormal cardiovascular stress test  -With ongoing exertional chest tightness with associated SOB and abnormal Lexiscan Myoview, will further assess with a LHC  -We had a long discussion regarding benefits and risks of a left heart catheterization including risks of bleeding, infection, pseudoaneurysm, heart attack, stroke, and death. Patient aware of the various outcomes of the procedure including recommendation for medical management, the need for coronary intervention, and potential  referral for CABG. Patient voiced understanding and wishes to proceed with the procedure as discussed  5. Chest pain at rest  -See above     Orders Placed This Encounter  Procedures  . Basic Metabolic Panel (BMP)  . Complete Blood Count (CBC)   Return after cath.  Javier Docker Talik Casique MD  Pt seen and examined. No change from above.

## 2021-01-24 ENCOUNTER — Other Ambulatory Visit: Payer: Self-pay | Admitting: Orthopedic Surgery

## 2021-01-24 DIAGNOSIS — M4807 Spinal stenosis, lumbosacral region: Secondary | ICD-10-CM

## 2021-01-24 DIAGNOSIS — M5442 Lumbago with sciatica, left side: Secondary | ICD-10-CM

## 2021-01-24 DIAGNOSIS — G8929 Other chronic pain: Secondary | ICD-10-CM

## 2021-02-07 ENCOUNTER — Ambulatory Visit
Admission: RE | Admit: 2021-02-07 | Discharge: 2021-02-07 | Disposition: A | Discharge status: Home / Self Care | Payer: BC Managed Care – PPO | Source: Ambulatory Visit | Attending: Orthopedic Surgery | Admitting: Orthopedic Surgery

## 2021-02-07 ENCOUNTER — Other Ambulatory Visit: Payer: Self-pay

## 2021-02-07 DIAGNOSIS — M5442 Lumbago with sciatica, left side: Secondary | ICD-10-CM | POA: Diagnosis present

## 2021-02-07 DIAGNOSIS — M5441 Lumbago with sciatica, right side: Secondary | ICD-10-CM | POA: Insufficient documentation

## 2021-02-07 DIAGNOSIS — G8929 Other chronic pain: Secondary | ICD-10-CM | POA: Diagnosis present

## 2021-02-07 DIAGNOSIS — M4807 Spinal stenosis, lumbosacral region: Secondary | ICD-10-CM | POA: Insufficient documentation

## 2021-02-17 ENCOUNTER — Other Ambulatory Visit: Payer: Self-pay | Admitting: Family Medicine

## 2021-02-17 DIAGNOSIS — Z1231 Encounter for screening mammogram for malignant neoplasm of breast: Secondary | ICD-10-CM

## 2021-02-21 ENCOUNTER — Other Ambulatory Visit: Payer: Self-pay

## 2021-02-21 ENCOUNTER — Ambulatory Visit
Admission: RE | Admit: 2021-02-21 | Discharge: 2021-02-21 | Disposition: A | Discharge status: Home / Self Care | Payer: BC Managed Care – PPO | Source: Ambulatory Visit | Attending: Family Medicine | Admitting: Family Medicine

## 2021-02-21 DIAGNOSIS — Z1231 Encounter for screening mammogram for malignant neoplasm of breast: Secondary | ICD-10-CM | POA: Insufficient documentation

## 2021-12-01 ENCOUNTER — Other Ambulatory Visit: Payer: Self-pay | Admitting: Gastroenterology

## 2021-12-01 DIAGNOSIS — K529 Noninfective gastroenteritis and colitis, unspecified: Secondary | ICD-10-CM

## 2021-12-01 DIAGNOSIS — R195 Other fecal abnormalities: Secondary | ICD-10-CM

## 2022-01-31 ENCOUNTER — Ambulatory Visit (HOSPITAL_COMMUNITY)
Admission: RE | Admit: 2022-01-31 | Discharge: 2022-01-31 | Disposition: A | Discharge status: Home / Self Care | Payer: BC Managed Care – PPO | Source: Ambulatory Visit | Attending: Gastroenterology | Admitting: Gastroenterology

## 2022-01-31 DIAGNOSIS — E119 Type 2 diabetes mellitus without complications: Secondary | ICD-10-CM | POA: Insufficient documentation

## 2022-01-31 DIAGNOSIS — R195 Other fecal abnormalities: Secondary | ICD-10-CM | POA: Diagnosis present

## 2022-01-31 DIAGNOSIS — K529 Noninfective gastroenteritis and colitis, unspecified: Secondary | ICD-10-CM | POA: Diagnosis present

## 2022-01-31 LAB — POCT I-STAT CREATININE: Creatinine, Ser: 0.6 mg/dL (ref 0.44–1.00)

## 2022-01-31 MED ORDER — IOHEXOL 300 MG/ML  SOLN
100.0000 mL | Freq: Once | INTRAMUSCULAR | Status: AC | PRN
Start: 2022-01-31 — End: 2022-01-31
  Administered 2022-01-31: 100 mL via INTRAVENOUS

## 2022-01-31 MED ORDER — SODIUM CHLORIDE (PF) 0.9 % IJ SOLN
INTRAMUSCULAR | Status: AC
  Filled 2022-01-31: qty 50

## 2022-01-31 MED ORDER — BARIUM SULFATE 0.1 % PO SUSP
ORAL | Status: AC
  Filled 2022-01-31: qty 3

## 2022-03-20 ENCOUNTER — Encounter
Admission: RE | Disposition: A | Discharge status: Home / Self Care | Payer: Self-pay | Source: Home / Self Care | Attending: Gastroenterology

## 2022-03-20 ENCOUNTER — Encounter: Payer: Self-pay | Admitting: *Deleted

## 2022-03-20 ENCOUNTER — Other Ambulatory Visit: Payer: Self-pay

## 2022-03-20 ENCOUNTER — Ambulatory Visit
Admission: RE | Admit: 2022-03-20 | Discharge: 2022-03-20 | Disposition: A | Discharge status: Home / Self Care | Payer: BC Managed Care – PPO | Attending: Gastroenterology | Admitting: Gastroenterology

## 2022-03-20 ENCOUNTER — Ambulatory Visit: Payer: BC Managed Care – PPO | Admitting: Anesthesiology

## 2022-03-20 DIAGNOSIS — Z6837 Body mass index (BMI) 37.0-37.9, adult: Secondary | ICD-10-CM | POA: Diagnosis not present

## 2022-03-20 DIAGNOSIS — K640 First degree hemorrhoids: Secondary | ICD-10-CM | POA: Diagnosis not present

## 2022-03-20 DIAGNOSIS — R933 Abnormal findings on diagnostic imaging of other parts of digestive tract: Secondary | ICD-10-CM | POA: Diagnosis present

## 2022-03-20 DIAGNOSIS — K5000 Crohn's disease of small intestine without complications: Secondary | ICD-10-CM | POA: Insufficient documentation

## 2022-03-20 DIAGNOSIS — E119 Type 2 diabetes mellitus without complications: Secondary | ICD-10-CM | POA: Insufficient documentation

## 2022-03-20 DIAGNOSIS — K6389 Other specified diseases of intestine: Secondary | ICD-10-CM | POA: Diagnosis not present

## 2022-03-20 DIAGNOSIS — K219 Gastro-esophageal reflux disease without esophagitis: Secondary | ICD-10-CM | POA: Insufficient documentation

## 2022-03-20 DIAGNOSIS — E669 Obesity, unspecified: Secondary | ICD-10-CM | POA: Insufficient documentation

## 2022-03-20 HISTORY — PX: COLONOSCOPY WITH PROPOFOL: SHX5780

## 2022-03-20 SURGERY — COLONOSCOPY WITH PROPOFOL
Anesthesia: General

## 2022-03-20 MED ORDER — PROPOFOL 500 MG/50ML IV EMUL
INTRAVENOUS | Status: DC | PRN
  Administered 2022-03-20: 30 mg via INTRAVENOUS
  Administered 2022-03-20: 120 ug/kg/min via INTRAVENOUS
  Administered 2022-03-20: 50 mg via INTRAVENOUS

## 2022-03-20 MED ORDER — SODIUM CHLORIDE 0.9 % IV SOLN: INTRAVENOUS | Status: DC

## 2022-03-20 NOTE — Op Note (Signed)
[facility] Patient Name: Alison Odonnell Procedure Date: 03/20/2022 1:11 PM MRN: [id number] Account #: [id number] Date of Birth: 22-Jan-1971 Admit Type: Outpatient Age: 51 Room: [facility] ENDO ROOM 3 Gender: Female Note Status: Supervisor Override Instrument Name: Jasper Riling [id number] Procedure:             Colonoscopy Indications:           Abnormal CT of the GI tract, Elevated Fecal                         Calprotectin Providers:             Andrey Farmer MD, MD Medicines:             Monitored Anesthesia Care Complications:         No immediate complications. Estimated blood loss:                         Minimal. Procedure:             Pre-Anesthesia Assessment:                        - Prior to the procedure, a History and Physical was                         performed, and patient medications and allergies were                         reviewed. The patient is competent. The risks and                         benefits of the procedure and the sedation options and                         risks were discussed with the patient. All questions                         were answered and informed consent was obtained.                         Patient identification and proposed procedure were                         verified by the physician, the nurse, the                         anesthesiologist, the anesthetist and the technician                         in the endoscopy suite. Mental Status Examination:                         alert and oriented. Airway Examination: normal                         oropharyngeal airway and neck mobility. Respiratory                         Examination: clear to auscultation. CV Examination:  normal. Prophylactic Antibiotics: The patient does not                         require prophylactic antibiotics. Prior                         Anticoagulants: The patient has taken no anticoagulant                          or antiplatelet agents. ASA Grade Assessment: III - A                         patient with severe systemic disease. After reviewing                         the risks and benefits, the patient was deemed in                         satisfactory condition to undergo the procedure. The                         anesthesia plan was to use monitored anesthesia care                         (MAC). Immediately prior to administration of                         medications, the patient was re-assessed for adequacy                         to receive sedatives. The heart rate, respiratory                         rate, oxygen saturations, blood pressure, adequacy of                         pulmonary ventilation, and response to care were                         monitored throughout the procedure. The physical                         status of the patient was re-assessed after the                         procedure.                        After obtaining informed consent, the colonoscope was                         passed under direct vision. Throughout the procedure,                         the patient's blood pressure, pulse, and oxygen                         saturations were monitored continuously. The  Colonoscope was introduced through the anus and                         advanced to the the terminal ileum. The colonoscopy                         was performed without difficulty. The patient                         tolerated the procedure well. The quality of the bowel                         preparation was good. The terminal ileum, ileocecal                         valve, appendiceal orifice, and rectum were                         photographed. Findings:      The perianal and digital rectal examinations were normal.      The terminal ileum contained multiple patchy non-bleeding aphthae. No       stigmata of recent bleeding were seen. This was biopsied with a cold        forceps for histology. Estimated blood loss was minimal.      Normal mucosa was found in the entire colon. Biopsies were taken with a       cold forceps for histology. Estimated blood loss was minimal.      Internal hemorrhoids were found during retroflexion. The hemorrhoids       were Grade I (internal hemorrhoids that do not prolapse).      The exam was otherwise without abnormality on direct and retroflexion       views. Impression:            - Aphtha in the terminal ileum. Biopsied.                        - Normal mucosa in the entire examined colon. Biopsied.                        - Internal hemorrhoids.                        - The examination was otherwise normal on direct and                         retroflexion views. Recommendation:        - Discharge patient to home.                        - Resume previous diet.                        - Continue present medications.                        - Await pathology results.                        - Return to referring physician as previously  scheduled. Procedure Code(s):     --- Professional ---                        [phone], Colonoscopy, flexible; with biopsy, single or                         multiple Diagnosis Code(s):     --- Professional ---                        K63.89, Other specified diseases of intestine                        K64.0, First degree hemorrhoids                        R93.3, Abnormal findings on diagnostic imaging of                         other parts of digestive tract CPT copyright 2022 American [employer] Association. All rights reserved. The codes documented in this report are preliminary and upon coder review may  be revised to meet current compliance requirements. Andrey Farmer MD, MD 03/20/2022 1:51:20 PM Number of Addenda: 0 Note Initiated On: 03/20/2022 1:11 PM Scope Withdrawal Time: 0 hours 8 minutes 49 seconds  Total Procedure Duration: 0 hours 13 minutes 55  seconds  Estimated Blood Loss:  Estimated blood loss was minimal.      [facility]

## 2022-03-20 NOTE — Interval H&P Note (Signed)
History and Physical Interval Note:  03/20/2022 1:24 PM  Alison Odonnell  has presented today for surgery, with the diagnosis of Elevated Fecal Calprotectin.  The various methods of treatment have been discussed with the patient and family. After consideration of risks, benefits and other options for treatment, the patient has consented to  Procedure(s): COLONOSCOPY WITH PROPOFOL (N/A) as a surgical intervention.  The patient's history has been reviewed, patient examined, no change in status, stable for surgery.  I have reviewed the patient's chart and labs.  Questions were answered to the patient's satisfaction.     Lesly Rubenstein  Ok to proceed with colonoscopy

## 2022-03-20 NOTE — Transfer of Care (Signed)
Immediate Anesthesia Transfer of Care Note  Patient: Alison Odonnell  Procedure(s) Performed: COLONOSCOPY WITH PROPOFOL  Patient Location: PACU  Anesthesia Type:General  Level of Consciousness: awake  Airway & Oxygen Therapy: Patient Spontanous Breathing and Patient connected to nasal cannula oxygen  Post-op Assessment: Report given to RN and Post -op Vital signs reviewed and stable  Post vital signs: Reviewed and stable  Last Vitals:  Vitals Value Taken Time  BP 111/57 03/20/22 1350  Temp 36.1 C 03/20/22 1349  Pulse 58 03/20/22 1351  Resp 16 03/20/22 1351  SpO2 99 % 03/20/22 1351  Vitals shown include unvalidated device data.  Last Pain:  Vitals:   03/20/22 1349  TempSrc: Temporal  PainSc:          Complications: No notable events documented.

## 2022-03-20 NOTE — Anesthesia Preprocedure Evaluation (Signed)
Anesthesia Evaluation  Patient identified by MRN, date of birth, ID band Patient awake    Reviewed: Allergy & Precautions, NPO status , Patient's Chart, lab work & pertinent test results  History of Anesthesia Complications Negative for: history of anesthetic complications  Airway Mallampati: III  TM Distance: >3 FB Neck ROM: full    Dental  (+) Chipped   Pulmonary neg pulmonary ROS, neg shortness of breath,    Pulmonary exam normal        Cardiovascular negative cardio ROS Normal cardiovascular exam     Neuro/Psych negative neurological ROS  negative psych ROS   GI/Hepatic Neg liver ROS, GERD  Controlled,  Endo/Other  diabetes, Type 2  Renal/GU negative Renal ROS  negative genitourinary   Musculoskeletal   Abdominal   Peds  Hematology negative hematology ROS (+)   Anesthesia Other Findings Past Medical History: No date: Allergy No date: Anal fissure     Comment:  1991 No date: Arthritis No date: GERD (gastroesophageal reflux disease) No date: Obesity  Past Surgical History: No date: HAND SURGERY No date: HAND TENDON SURGERY 02/15/2020: LEFT HEART CATH AND CORONARY ANGIOGRAPHY; Left     Comment:  Procedure: LEFT HEART CATH AND CORONARY ANGIOGRAPHY;                Surgeon: Teodoro Spray, MD;  Location: [facility]              [facility];  Service: Cardiovascular;  Laterality: Left;     Reproductive/Obstetrics negative OB ROS                             Anesthesia Physical Anesthesia Plan  ASA: 3  Anesthesia Plan: General   Post-op Pain Management:    Induction: Intravenous  PONV Risk Score and Plan: Propofol infusion and TIVA  Airway Management Planned: Natural Airway and Nasal Cannula  Additional Equipment:   Intra-op Plan:   Post-operative Plan:   Informed Consent: I have reviewed the patients History and Physical, chart, labs and discussed the procedure  including the risks, benefits and alternatives for the proposed anesthesia with the patient or authorized representative who has indicated his/her understanding and acceptance.     Dental Advisory Given  Plan Discussed with: Anesthesiologist, CRNA and Surgeon  Anesthesia Plan Comments: (Patient consented for risks of anesthesia including but not limited to:  - adverse reactions to medications - risk of airway placement if required - damage to eyes, teeth, lips or other oral mucosa - nerve damage due to positioning  - sore throat or hoarseness - Damage to heart, brain, nerves, lungs, other parts of body or loss of life  Patient voiced understanding.)        Anesthesia Quick Evaluation

## 2022-03-20 NOTE — H&P (Signed)
Outpatient short stay form Pre-procedure 03/20/2022  Lesly Rubenstein, MD  Primary Physician: Dion Body, MD  Reason for visit:  Abdominal pain  History of present illness:    51 y/o lady with history of obesity and CTE imaging showing possible small bowel inflammation. 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 No blood thinners. Daughter with UC. No significant abdominal surgeries.    Current Facility-Administered Medications:    0.9 %  sodium chloride infusion, , Intravenous, Continuous, Lucina Betty, Hilton Cork, MD, Last Rate: 20 mL/hr at 03/20/22 1321, New Bag at 03/20/22 1321  Medications Prior to Admission  Medication Sig Dispense Refill Last Dose   acetaminophen (TYLENOL) 500 MG tablet Take 500-1,000 mg by mouth every 6 (six) hours as needed (for pain.).   Past Week   ascorbic acid (VITAMIN C) 500 MG tablet Take 500 mg by mouth daily.   Past Week   Calcium Carb-Cholecalciferol (CALCIUM 600+D) 600-800 MG-UNIT TABS Take 1 tablet by mouth daily.   Past Week   ibuprofen (ADVIL) 200 MG tablet Take 200-400 mg by mouth every 8 (eight) hours as needed (pain.).   Past Week   levocetirizine (XYZAL) 5 MG tablet Take 5 mg by mouth daily.   Past Week   Multiple Vitamin (MULTIVITAMIN WITH MINERALS) TABS tablet Take 1 tablet by mouth daily.   Past Week   naproxen sodium (ALEVE) 220 MG tablet Take 220-440 mg by mouth daily as needed (pain.).   Past Week   omeprazole (PRILOSEC) 20 MG capsule Take 40 mg by mouth daily before breakfast.   03/19/2022   pravastatin (PRAVACHOL) 40 MG tablet Take 40 mg by mouth at bedtime.   03/19/2022   TURMERIC CURCUMIN PO Take 1,000 mg by mouth daily.   Past Week   valACYclovir (VALTREX) 1000 MG tablet Take 2,000 mg by mouth 2 (two) times daily as needed.   Past Week   vitamin B-12 (CYANOCOBALAMIN) 1000 MCG tablet Take 1,000 mcg by mouth daily.   Past Week      Allergies  Allergen Reactions   Penicillins Nausea And Vomiting   Phentermine Hives   Shellfish Allergy Nausea Only and Nausea And Vomiting   Sulfa Antibiotics Other (See Comments)    Childhood reaction     Past Medical History:  Diagnosis Date   Allergy    Anal fissure    1991   Arthritis    GERD (gastroesophageal reflux disease)    Obesity     Review of systems:  Otherwise negative.    Physical Exam  Gen: Alert, oriented. Appears stated age.  HEENT: PERRLA. Lungs: No respiratory distress CV: RRR Abd: soft, benign, no masses Ext: No edema    Planned procedures: Proceed with colonoscopy. The patient understands the nature of the planned procedure, indications, risks, alternatives and potential complications including but not limited to bleeding, infection, perforation, damage to internal organs and possible oversedation/side effects from anesthesia. The patient agrees and gives consent to proceed.  Please refer to procedure notes for findings, recommendations and patient disposition/instructions.     Lesly Rubenstein, MD [facility]

## 2022-03-24 LAB — SURGICAL PATHOLOGY

## 2022-03-24 NOTE — Anesthesia Postprocedure Evaluation (Signed)
Anesthesia Post Note  Patient: Alison Odonnell  Procedure(s) Performed: COLONOSCOPY WITH PROPOFOL  Patient location during evaluation: PACU Anesthesia Type: General Level of consciousness: awake and alert Pain management: pain level controlled Vital Signs Assessment: post-procedure vital signs reviewed and stable Respiratory status: spontaneous breathing, nonlabored ventilation and respiratory function stable Cardiovascular status: blood pressure returned to baseline and stable Postop Assessment: no apparent nausea or vomiting Anesthetic complications: no   No notable events documented.   Last Vitals:  Vitals:   03/20/22 1349 03/20/22 1359  BP: (!) 111/57 132/83  Pulse:    Resp:    Temp: (!) 36.1 C   SpO2: 99%     Last Pain:  Vitals:   03/21/22 1418  TempSrc:   PainSc: 0-No pain                 [place] Ouch

## 2022-12-27 IMAGING — MG MM DIGITAL SCREENING BILAT W/ TOMO AND CAD
8 series · 8 of 24 positions shown · non-contrast
Comparison: Previous exam(s).

CLINICAL DATA: Screening.

EXAM:
DIGITAL SCREENING BILATERAL MAMMOGRAM WITH TOMOSYNTHESIS AND CAD
TECHNIQUE: Bilateral screening digital craniocaudal and mediolateral oblique
mammograms were obtained. Bilateral screening digital breast
tomosynthesis was performed. The images were evaluated with
computer-aided detection.

[L MLO synth-2D]
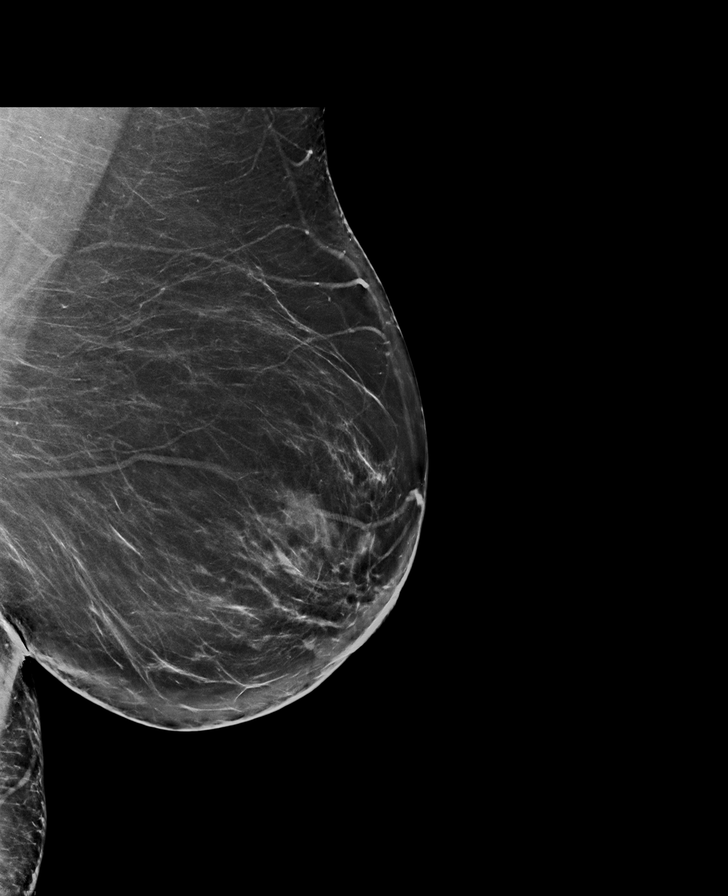

[L CC synth-2D]
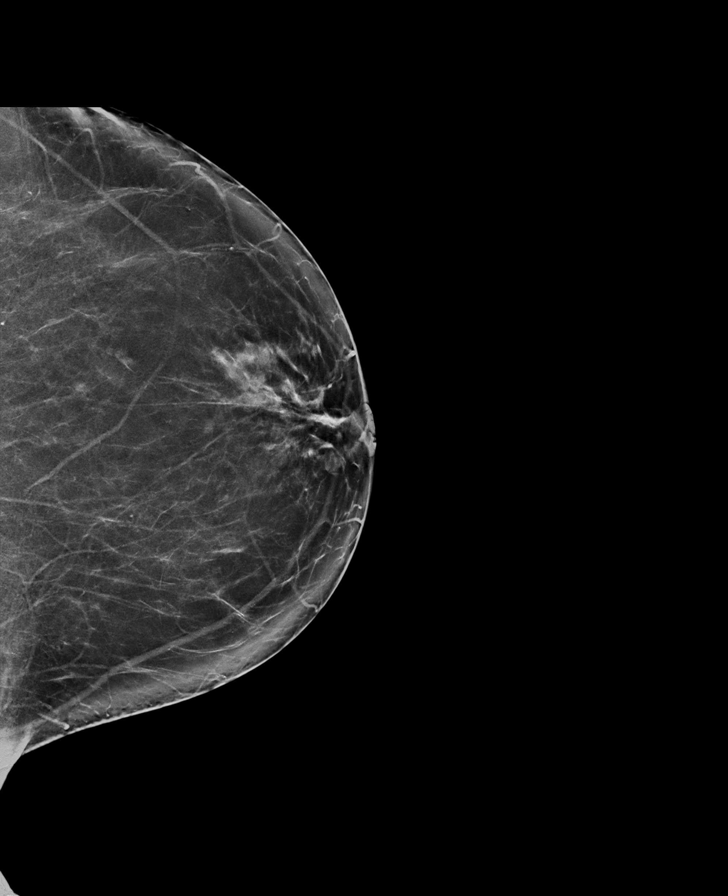

[R CC synth-2D]
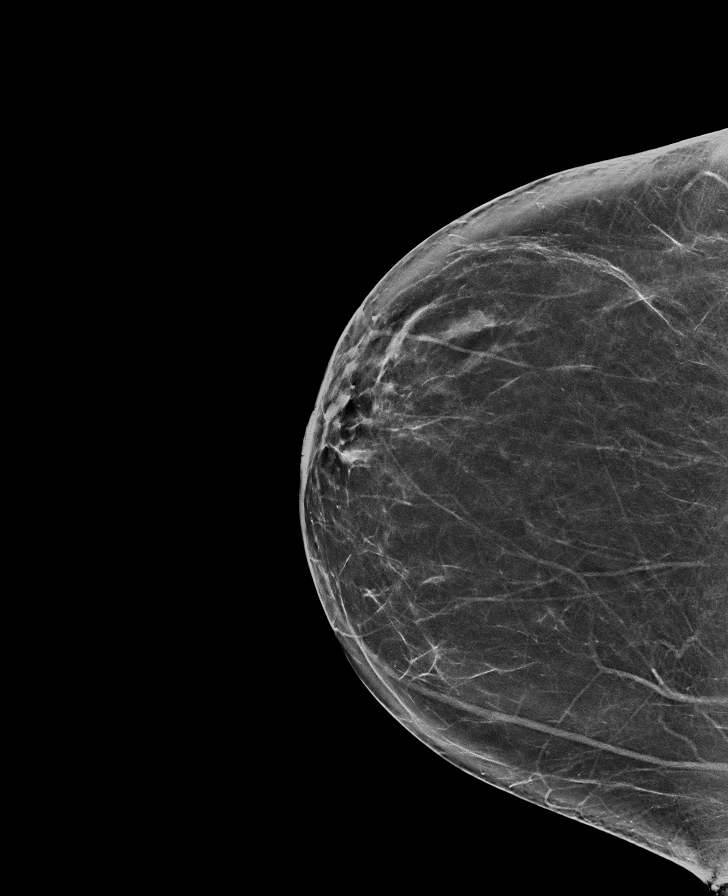

[R MLO synth-2D]
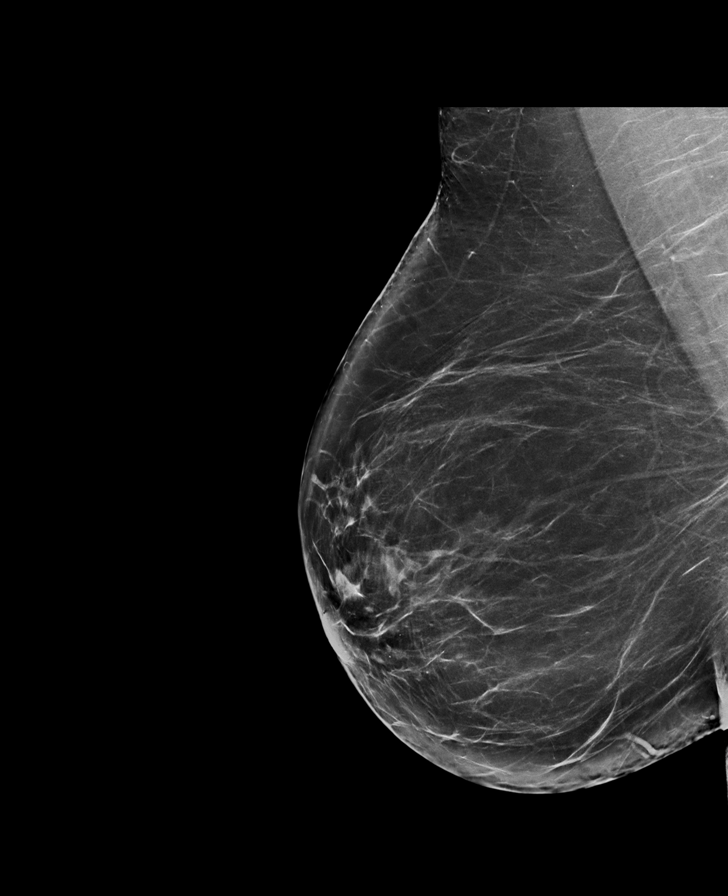

[L MLO tomo · tomo slice 43/86.0]
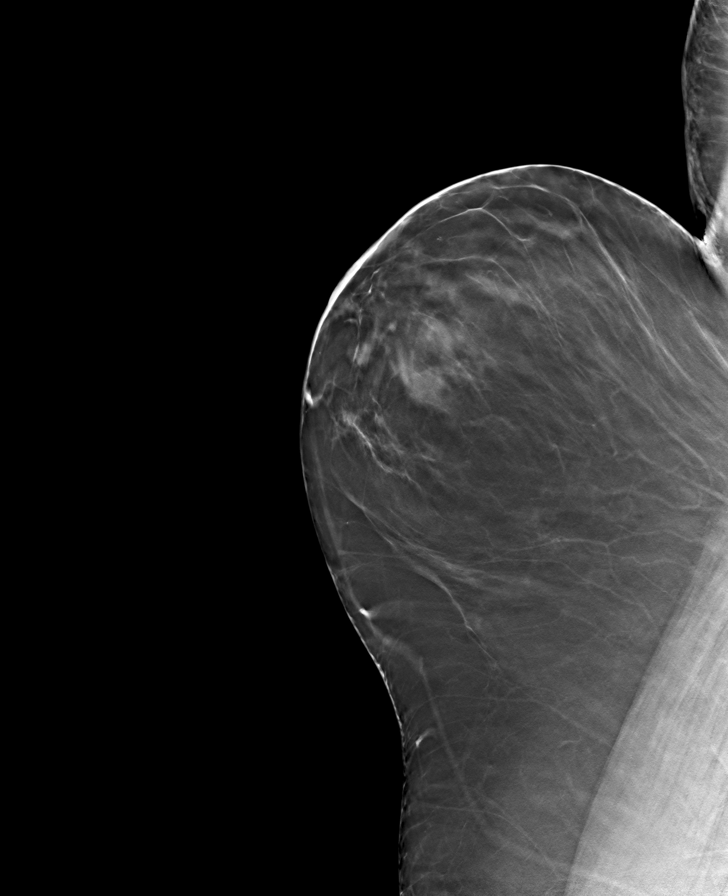

[R CC tomo · tomo slice 38/75.0]
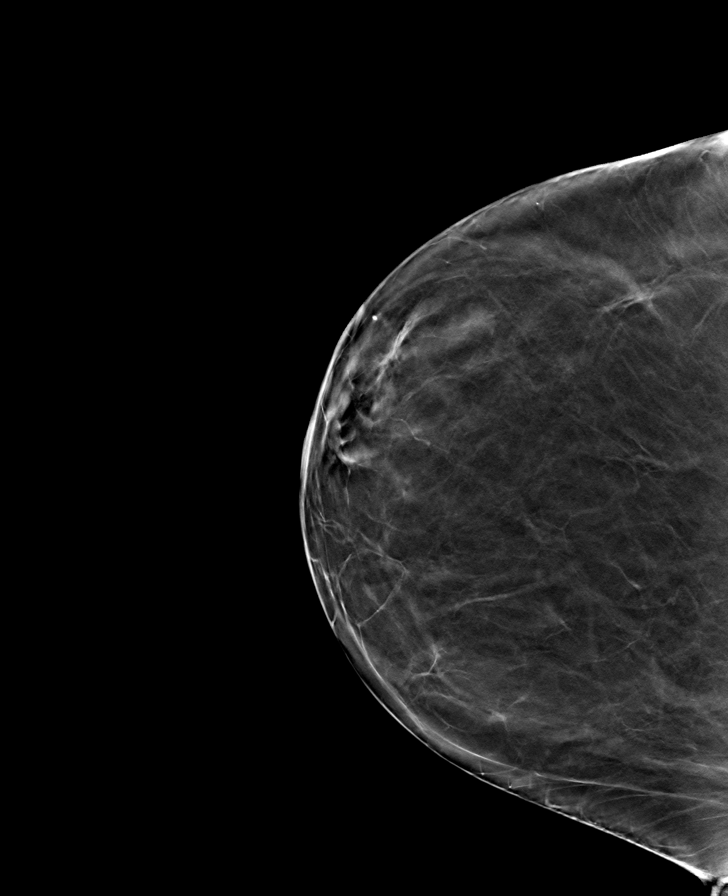

[R MLO tomo · tomo slice 45/89.0]
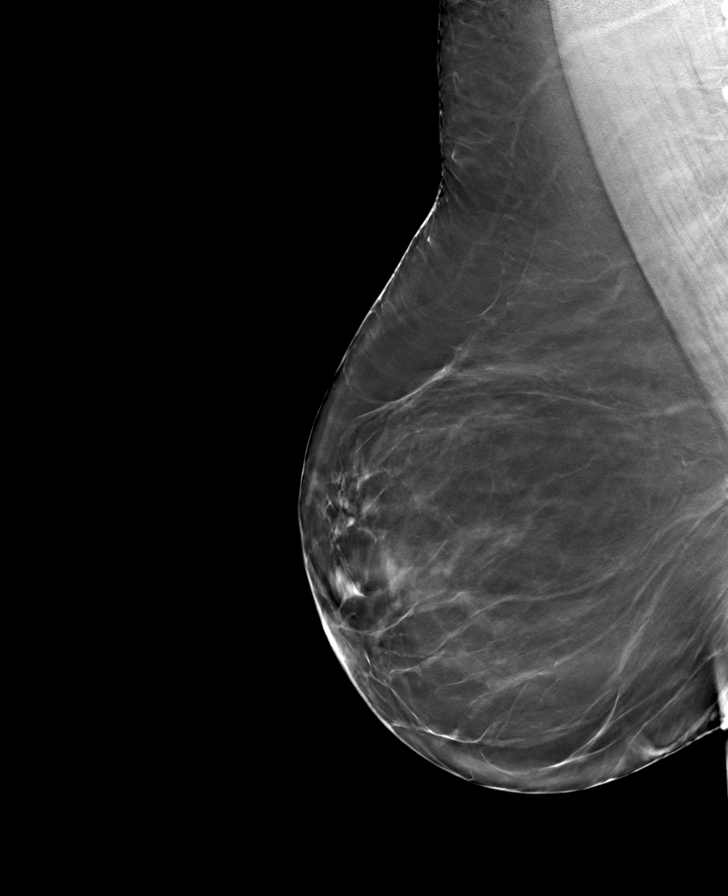

[L CC tomo · tomo slice 39/77.0]
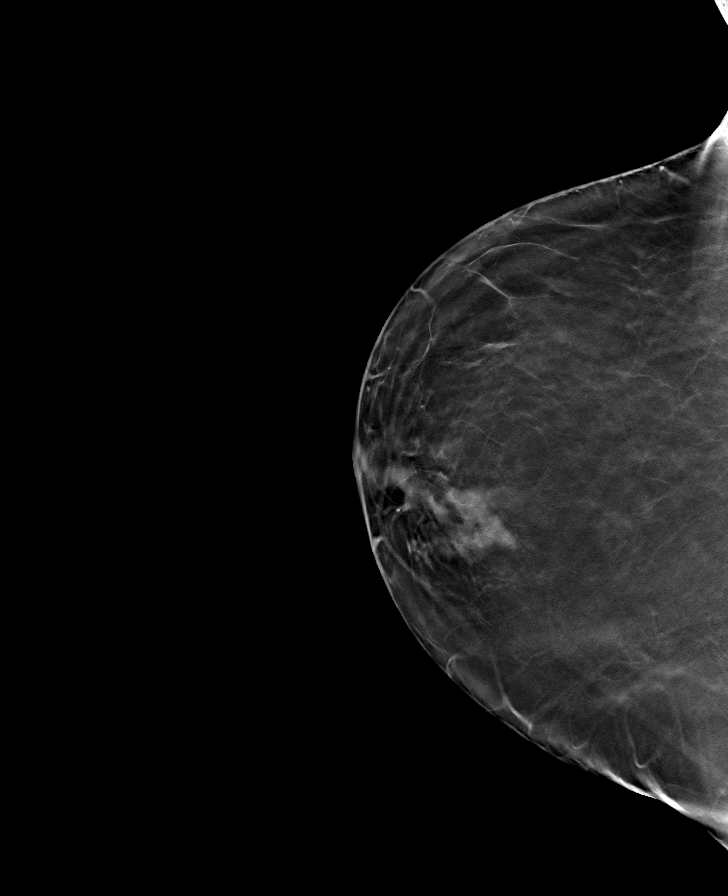

[8 of 24 positions shown; findings below may reference images not displayed]

ACR Breast Density Category b: There are scattered areas of
fibroglandular density.
FINDINGS: There are no findings suspicious for malignancy.
IMPRESSION: No mammographic evidence of malignancy. A result letter of this
screening mammogram will be mailed directly to the patient.

RECOMMENDATION:
Screening mammogram in one year. (Code:51-O-LD2)

BI-RADS CATEGORY  1: Negative.

## 2023-06-24 ENCOUNTER — Ambulatory Visit: Payer: BC Managed Care – PPO

## 2023-06-24 DIAGNOSIS — K5730 Diverticulosis of large intestine without perforation or abscess without bleeding: Secondary | ICD-10-CM | POA: Diagnosis not present

## 2023-06-24 DIAGNOSIS — K640 First degree hemorrhoids: Secondary | ICD-10-CM | POA: Diagnosis not present

## 2023-06-24 DIAGNOSIS — K5000 Crohn's disease of small intestine without complications: Secondary | ICD-10-CM | POA: Diagnosis present

## 2024-01-20 ENCOUNTER — Other Ambulatory Visit: Payer: Self-pay | Admitting: Orthopedic Surgery

## 2024-01-20 DIAGNOSIS — M4807 Spinal stenosis, lumbosacral region: Secondary | ICD-10-CM

## 2024-01-29 ENCOUNTER — Ambulatory Visit
Admission: RE | Admit: 2024-01-29 | Discharge: 2024-01-29 | Disposition: A | Discharge status: Home / Self Care | Source: Ambulatory Visit | Attending: Orthopedic Surgery | Admitting: Orthopedic Surgery

## 2024-01-29 DIAGNOSIS — M4807 Spinal stenosis, lumbosacral region: Secondary | ICD-10-CM
